# Patient Record
Sex: Female | Born: 1975
Health system: Southern US, Community
[De-identification: ages and names within clinical notes are randomized; demographics above are authoritative.]

## PROBLEM LIST (undated history)

## (undated) DIAGNOSIS — M5126 Other intervertebral disc displacement, lumbar region: Secondary | ICD-10-CM

## (undated) DIAGNOSIS — O99280 Endocrine, nutritional and metabolic diseases complicating pregnancy, unspecified trimester: Secondary | ICD-10-CM

## (undated) DIAGNOSIS — N819 Female genital prolapse, unspecified: Secondary | ICD-10-CM

## (undated) DIAGNOSIS — O1490 Unspecified pre-eclampsia, unspecified trimester: Secondary | ICD-10-CM

## (undated) DIAGNOSIS — E039 Hypothyroidism, unspecified: Secondary | ICD-10-CM

## (undated) DIAGNOSIS — D649 Anemia, unspecified: Secondary | ICD-10-CM

## (undated) DIAGNOSIS — M5136 Other intervertebral disc degeneration, lumbar region: Secondary | ICD-10-CM

## (undated) DIAGNOSIS — D352 Benign neoplasm of pituitary gland: Secondary | ICD-10-CM

## (undated) DIAGNOSIS — E221 Hyperprolactinemia: Secondary | ICD-10-CM

## (undated) HISTORY — DX: Female genital prolapse, unspecified: N81.9

## (undated) HISTORY — DX: Anemia, unspecified: D64.9

## (undated) HISTORY — DX: Other intervertebral disc degeneration, lumbar region: M51.36

## (undated) HISTORY — DX: Benign neoplasm of pituitary gland: D35.2

## (undated) HISTORY — DX: Endocrine, nutritional and metabolic diseases complicating pregnancy, unspecified trimester: O99.280

## (undated) HISTORY — DX: Unspecified pre-eclampsia, unspecified trimester: O14.90

## (undated) HISTORY — PX: MOUTH SURGERY: SHX715

## (undated) HISTORY — PX: OTHER SURGICAL HISTORY: SHX169

## (undated) HISTORY — DX: Other intervertebral disc displacement, lumbar region: M51.26

## (undated) HISTORY — DX: Hyperprolactinemia: E22.1

## (undated) HISTORY — DX: Hypothyroidism, unspecified: E03.9

---

## 1998-08-29 ENCOUNTER — Encounter: Payer: Self-pay | Admitting: Obstetrics and Gynecology

## 1998-08-29 ENCOUNTER — Inpatient Hospital Stay (HOSPITAL_COMMUNITY): Admission: AD | Admit: 1998-08-29 | Discharge: 1998-08-29 | Payer: Self-pay | Admitting: Obstetrics and Gynecology

## 1998-12-04 ENCOUNTER — Other Ambulatory Visit: Admission: RE | Admit: 1998-12-04 | Discharge: 1998-12-04 | Payer: Self-pay | Admitting: Obstetrics & Gynecology

## 1998-12-25 ENCOUNTER — Ambulatory Visit (HOSPITAL_COMMUNITY): Admission: RE | Admit: 1998-12-25 | Discharge: 1998-12-25 | Payer: Self-pay | Admitting: Obstetrics & Gynecology

## 1998-12-25 ENCOUNTER — Encounter: Payer: Self-pay | Admitting: Obstetrics & Gynecology

## 1999-10-02 ENCOUNTER — Inpatient Hospital Stay (HOSPITAL_COMMUNITY): Admission: AD | Admit: 1999-10-02 | Discharge: 1999-10-02 | Payer: Self-pay | Admitting: Obstetrics & Gynecology

## 1999-11-08 ENCOUNTER — Inpatient Hospital Stay (HOSPITAL_COMMUNITY): Admission: AD | Admit: 1999-11-08 | Discharge: 1999-11-10 | Payer: Self-pay | Admitting: Obstetrics and Gynecology

## 1999-12-09 ENCOUNTER — Other Ambulatory Visit: Admission: RE | Admit: 1999-12-09 | Discharge: 1999-12-09 | Payer: Self-pay | Admitting: Obstetrics and Gynecology

## 2001-03-23 ENCOUNTER — Other Ambulatory Visit: Admission: RE | Admit: 2001-03-23 | Discharge: 2001-03-23 | Payer: Self-pay | Admitting: Obstetrics & Gynecology

## 2001-04-02 ENCOUNTER — Ambulatory Visit (HOSPITAL_COMMUNITY): Admission: RE | Admit: 2001-04-02 | Discharge: 2001-04-02 | Payer: Self-pay | Admitting: Obstetrics & Gynecology

## 2001-04-02 ENCOUNTER — Encounter: Payer: Self-pay | Admitting: Obstetrics & Gynecology

## 2002-07-12 ENCOUNTER — Other Ambulatory Visit: Admission: RE | Admit: 2002-07-12 | Discharge: 2002-07-12 | Payer: Self-pay | Admitting: Obstetrics and Gynecology

## 2002-07-28 ENCOUNTER — Emergency Department (HOSPITAL_COMMUNITY): Admission: EM | Admit: 2002-07-28 | Discharge: 2002-07-28 | Payer: Self-pay | Admitting: Emergency Medicine

## 2003-05-09 ENCOUNTER — Encounter: Payer: Self-pay | Admitting: Internal Medicine

## 2003-05-09 ENCOUNTER — Ambulatory Visit (HOSPITAL_COMMUNITY): Admission: RE | Admit: 2003-05-09 | Discharge: 2003-05-09 | Payer: Self-pay | Admitting: Internal Medicine

## 2003-07-28 ENCOUNTER — Other Ambulatory Visit: Admission: RE | Admit: 2003-07-28 | Discharge: 2003-07-28 | Payer: Self-pay | Admitting: Obstetrics & Gynecology

## 2004-01-31 ENCOUNTER — Ambulatory Visit (HOSPITAL_COMMUNITY): Admission: RE | Admit: 2004-01-31 | Discharge: 2004-01-31 | Payer: Self-pay | Admitting: Internal Medicine

## 2004-06-22 ENCOUNTER — Inpatient Hospital Stay (HOSPITAL_COMMUNITY): Admission: AD | Admit: 2004-06-22 | Discharge: 2004-06-22 | Payer: Self-pay | Admitting: Obstetrics & Gynecology

## 2004-07-01 ENCOUNTER — Inpatient Hospital Stay (HOSPITAL_COMMUNITY): Admission: AD | Admit: 2004-07-01 | Discharge: 2004-07-01 | Payer: Self-pay | Admitting: Obstetrics & Gynecology

## 2004-07-07 ENCOUNTER — Inpatient Hospital Stay (HOSPITAL_COMMUNITY): Admission: AD | Admit: 2004-07-07 | Discharge: 2004-07-09 | Payer: Self-pay | Admitting: Obstetrics and Gynecology

## 2004-08-20 ENCOUNTER — Other Ambulatory Visit: Admission: RE | Admit: 2004-08-20 | Discharge: 2004-08-20 | Payer: Self-pay | Admitting: Obstetrics and Gynecology

## 2005-09-03 ENCOUNTER — Other Ambulatory Visit: Admission: RE | Admit: 2005-09-03 | Discharge: 2005-09-03 | Payer: Self-pay | Admitting: Obstetrics and Gynecology

## 2006-07-27 ENCOUNTER — Inpatient Hospital Stay (HOSPITAL_COMMUNITY): Admission: AD | Admit: 2006-07-27 | Discharge: 2006-07-27 | Payer: Self-pay | Admitting: Obstetrics & Gynecology

## 2006-08-06 ENCOUNTER — Inpatient Hospital Stay (HOSPITAL_COMMUNITY): Admission: AD | Admit: 2006-08-06 | Discharge: 2006-08-09 | Payer: Self-pay | Admitting: Obstetrics and Gynecology

## 2007-06-08 ENCOUNTER — Emergency Department (HOSPITAL_COMMUNITY): Admission: EM | Admit: 2007-06-08 | Discharge: 2007-06-08 | Payer: Self-pay | Admitting: Emergency Medicine

## 2008-05-08 ENCOUNTER — Ambulatory Visit (HOSPITAL_COMMUNITY): Admission: RE | Admit: 2008-05-08 | Discharge: 2008-05-08 | Payer: Self-pay | Admitting: Internal Medicine

## 2009-08-20 ENCOUNTER — Encounter: Admission: RE | Admit: 2009-08-20 | Discharge: 2009-08-20 | Payer: Self-pay | Admitting: Family Medicine

## 2009-11-07 ENCOUNTER — Emergency Department (HOSPITAL_COMMUNITY): Admission: EM | Admit: 2009-11-07 | Discharge: 2009-11-07 | Payer: Self-pay | Admitting: Emergency Medicine

## 2009-12-06 ENCOUNTER — Encounter: Admission: RE | Admit: 2009-12-06 | Discharge: 2009-12-06 | Payer: Self-pay | Admitting: Internal Medicine

## 2009-12-22 ENCOUNTER — Emergency Department (HOSPITAL_COMMUNITY): Admission: EM | Admit: 2009-12-22 | Discharge: 2009-12-22 | Payer: Self-pay | Admitting: Emergency Medicine

## 2009-12-24 ENCOUNTER — Emergency Department (HOSPITAL_BASED_OUTPATIENT_CLINIC_OR_DEPARTMENT_OTHER): Admission: EM | Admit: 2009-12-24 | Discharge: 2009-12-24 | Payer: Self-pay | Admitting: Emergency Medicine

## 2009-12-24 ENCOUNTER — Ambulatory Visit: Payer: Self-pay | Admitting: Diagnostic Radiology

## 2010-03-20 ENCOUNTER — Other Ambulatory Visit: Admission: RE | Admit: 2010-03-20 | Discharge: 2010-03-20 | Payer: Self-pay | Admitting: Obstetrics and Gynecology

## 2010-05-21 ENCOUNTER — Encounter: Admission: RE | Admit: 2010-05-21 | Discharge: 2010-05-21 | Payer: Self-pay | Admitting: Obstetrics and Gynecology

## 2010-09-08 ENCOUNTER — Encounter: Payer: Self-pay | Admitting: Sports Medicine

## 2010-09-08 ENCOUNTER — Encounter: Payer: Self-pay | Admitting: Obstetrics and Gynecology

## 2010-09-09 ENCOUNTER — Encounter: Payer: Self-pay | Admitting: Internal Medicine

## 2011-03-24 ENCOUNTER — Other Ambulatory Visit: Payer: Self-pay | Admitting: Obstetrics and Gynecology

## 2011-03-24 ENCOUNTER — Other Ambulatory Visit (HOSPITAL_COMMUNITY)
Admission: RE | Admit: 2011-03-24 | Discharge: 2011-03-24 | Disposition: A | Discharge status: Home / Self Care | Payer: 59 | Source: Ambulatory Visit | Attending: Obstetrics and Gynecology | Admitting: Obstetrics and Gynecology

## 2011-03-24 DIAGNOSIS — Z01419 Encounter for gynecological examination (general) (routine) without abnormal findings: Secondary | ICD-10-CM | POA: Insufficient documentation

## 2011-03-24 DIAGNOSIS — N644 Mastodynia: Secondary | ICD-10-CM

## 2011-03-24 DIAGNOSIS — N6310 Unspecified lump in the right breast, unspecified quadrant: Secondary | ICD-10-CM

## 2011-03-28 ENCOUNTER — Ambulatory Visit
Admission: RE | Admit: 2011-03-28 | Discharge: 2011-03-28 | Disposition: A | Discharge status: Home / Self Care | Payer: 59 | Source: Ambulatory Visit | Attending: Obstetrics and Gynecology | Admitting: Obstetrics and Gynecology

## 2011-03-28 DIAGNOSIS — N644 Mastodynia: Secondary | ICD-10-CM

## 2011-03-28 DIAGNOSIS — N6310 Unspecified lump in the right breast, unspecified quadrant: Secondary | ICD-10-CM

## 2012-04-23 ENCOUNTER — Other Ambulatory Visit: Payer: Self-pay | Admitting: Obstetrics and Gynecology

## 2012-04-23 ENCOUNTER — Other Ambulatory Visit (HOSPITAL_COMMUNITY)
Admission: RE | Admit: 2012-04-23 | Discharge: 2012-04-23 | Disposition: A | Discharge status: Home / Self Care | Payer: 59 | Source: Ambulatory Visit | Attending: Obstetrics and Gynecology | Admitting: Obstetrics and Gynecology

## 2012-04-23 DIAGNOSIS — Z01419 Encounter for gynecological examination (general) (routine) without abnormal findings: Secondary | ICD-10-CM | POA: Insufficient documentation

## 2012-04-23 DIAGNOSIS — Z1151 Encounter for screening for human papillomavirus (HPV): Secondary | ICD-10-CM | POA: Insufficient documentation

## 2012-05-28 ENCOUNTER — Ambulatory Visit: Payer: 59 | Admitting: Physical Therapy

## 2012-05-31 ENCOUNTER — Ambulatory Visit: Payer: 59 | Attending: Obstetrics and Gynecology | Admitting: Physical Therapy

## 2012-05-31 DIAGNOSIS — M2420 Disorder of ligament, unspecified site: Secondary | ICD-10-CM | POA: Insufficient documentation

## 2012-05-31 DIAGNOSIS — M629 Disorder of muscle, unspecified: Secondary | ICD-10-CM | POA: Insufficient documentation

## 2012-05-31 DIAGNOSIS — IMO0001 Reserved for inherently not codable concepts without codable children: Secondary | ICD-10-CM | POA: Insufficient documentation

## 2012-06-07 ENCOUNTER — Ambulatory Visit: Payer: 59 | Admitting: Physical Therapy

## 2012-06-14 ENCOUNTER — Ambulatory Visit: Payer: 59 | Admitting: Physical Therapy

## 2012-06-21 ENCOUNTER — Ambulatory Visit: Payer: 59 | Attending: Obstetrics and Gynecology | Admitting: Physical Therapy

## 2012-06-21 DIAGNOSIS — M629 Disorder of muscle, unspecified: Secondary | ICD-10-CM | POA: Insufficient documentation

## 2012-06-21 DIAGNOSIS — IMO0001 Reserved for inherently not codable concepts without codable children: Secondary | ICD-10-CM | POA: Insufficient documentation

## 2012-06-21 DIAGNOSIS — M2420 Disorder of ligament, unspecified site: Secondary | ICD-10-CM | POA: Insufficient documentation

## 2012-06-28 ENCOUNTER — Ambulatory Visit: Payer: 59 | Admitting: Physical Therapy

## 2012-07-05 ENCOUNTER — Encounter: Payer: 59 | Admitting: Physical Therapy

## 2012-07-12 ENCOUNTER — Encounter: Payer: 59 | Admitting: Physical Therapy

## 2012-07-19 ENCOUNTER — Ambulatory Visit: Payer: 59 | Attending: Obstetrics and Gynecology | Admitting: Physical Therapy

## 2012-07-19 DIAGNOSIS — IMO0001 Reserved for inherently not codable concepts without codable children: Secondary | ICD-10-CM | POA: Insufficient documentation

## 2012-07-19 DIAGNOSIS — M2420 Disorder of ligament, unspecified site: Secondary | ICD-10-CM | POA: Insufficient documentation

## 2012-07-19 DIAGNOSIS — M629 Disorder of muscle, unspecified: Secondary | ICD-10-CM | POA: Insufficient documentation

## 2012-07-26 ENCOUNTER — Ambulatory Visit: Payer: 59 | Admitting: Physical Therapy

## 2012-08-02 ENCOUNTER — Ambulatory Visit: Payer: 59 | Admitting: Physical Therapy

## 2012-08-09 ENCOUNTER — Ambulatory Visit: Payer: 59 | Admitting: Physical Therapy

## 2012-08-16 ENCOUNTER — Ambulatory Visit: Payer: 59 | Admitting: Physical Therapy

## 2013-03-09 ENCOUNTER — Other Ambulatory Visit (HOSPITAL_COMMUNITY): Payer: Self-pay | Admitting: Oral Surgery

## 2013-03-09 DIAGNOSIS — M2669 Other specified disorders of temporomandibular joint: Secondary | ICD-10-CM

## 2013-03-09 DIAGNOSIS — R6884 Jaw pain: Secondary | ICD-10-CM

## 2013-03-10 ENCOUNTER — Ambulatory Visit (HOSPITAL_COMMUNITY)
Admission: RE | Admit: 2013-03-10 | Discharge: 2013-03-10 | Disposition: A | Discharge status: Home / Self Care | Payer: 59 | Source: Ambulatory Visit | Attending: Oral Surgery | Admitting: Oral Surgery

## 2013-03-10 DIAGNOSIS — M2669 Other specified disorders of temporomandibular joint: Secondary | ICD-10-CM

## 2013-03-10 DIAGNOSIS — R6884 Jaw pain: Secondary | ICD-10-CM

## 2013-03-10 DIAGNOSIS — M26629 Arthralgia of temporomandibular joint, unspecified side: Secondary | ICD-10-CM | POA: Insufficient documentation

## 2014-06-15 ENCOUNTER — Other Ambulatory Visit: Payer: Self-pay

## 2014-06-15 DIAGNOSIS — Z1239 Encounter for other screening for malignant neoplasm of breast: Secondary | ICD-10-CM

## 2014-07-27 ENCOUNTER — Ambulatory Visit
Admission: RE | Admit: 2014-07-27 | Discharge: 2014-07-27 | Disposition: A | Discharge status: Home / Self Care | Payer: BC Managed Care – PPO | Source: Ambulatory Visit

## 2014-07-27 DIAGNOSIS — Z1239 Encounter for other screening for malignant neoplasm of breast: Secondary | ICD-10-CM

## 2014-09-28 ENCOUNTER — Other Ambulatory Visit (HOSPITAL_COMMUNITY)
Admission: RE | Admit: 2014-09-28 | Discharge: 2014-09-28 | Disposition: A | Discharge status: Home / Self Care | Payer: BLUE CROSS/BLUE SHIELD | Source: Ambulatory Visit | Attending: Obstetrics and Gynecology | Admitting: Obstetrics and Gynecology

## 2014-09-28 ENCOUNTER — Other Ambulatory Visit: Payer: Self-pay | Admitting: Obstetrics and Gynecology

## 2014-09-28 DIAGNOSIS — Z1151 Encounter for screening for human papillomavirus (HPV): Secondary | ICD-10-CM | POA: Diagnosis present

## 2014-09-28 DIAGNOSIS — Z01419 Encounter for gynecological examination (general) (routine) without abnormal findings: Secondary | ICD-10-CM | POA: Insufficient documentation

## 2014-10-02 LAB — CYTOLOGY - PAP

## 2015-07-24 ENCOUNTER — Other Ambulatory Visit: Payer: Self-pay | Admitting: Otolaryngology

## 2015-07-24 DIAGNOSIS — H90A22 Sensorineural hearing loss, unilateral, left ear, with restricted hearing on the contralateral side: Secondary | ICD-10-CM

## 2015-07-27 ENCOUNTER — Other Ambulatory Visit: Payer: Self-pay

## 2015-07-27 DIAGNOSIS — Z1231 Encounter for screening mammogram for malignant neoplasm of breast: Secondary | ICD-10-CM

## 2015-07-31 ENCOUNTER — Ambulatory Visit
Admission: RE | Admit: 2015-07-31 | Discharge: 2015-07-31 | Disposition: A | Discharge status: Home / Self Care | Payer: BLUE CROSS/BLUE SHIELD | Source: Ambulatory Visit | Attending: Otolaryngology | Admitting: Otolaryngology

## 2015-07-31 DIAGNOSIS — H90A22 Sensorineural hearing loss, unilateral, left ear, with restricted hearing on the contralateral side: Secondary | ICD-10-CM

## 2015-08-02 ENCOUNTER — Ambulatory Visit
Admission: RE | Admit: 2015-08-02 | Discharge: 2015-08-02 | Disposition: A | Discharge status: Home / Self Care | Payer: BLUE CROSS/BLUE SHIELD | Source: Ambulatory Visit

## 2015-08-02 DIAGNOSIS — Z1231 Encounter for screening mammogram for malignant neoplasm of breast: Secondary | ICD-10-CM

## 2015-10-02 ENCOUNTER — Other Ambulatory Visit (HOSPITAL_COMMUNITY)
Admission: RE | Admit: 2015-10-02 | Discharge: 2015-10-02 | Disposition: A | Discharge status: Home / Self Care | Payer: BLUE CROSS/BLUE SHIELD | Source: Ambulatory Visit | Attending: Obstetrics and Gynecology | Admitting: Obstetrics and Gynecology

## 2015-10-02 ENCOUNTER — Other Ambulatory Visit: Payer: Self-pay | Admitting: Obstetrics and Gynecology

## 2015-10-02 DIAGNOSIS — Z01419 Encounter for gynecological examination (general) (routine) without abnormal findings: Secondary | ICD-10-CM | POA: Insufficient documentation

## 2015-10-03 LAB — CYTOLOGY - PAP

## 2015-10-16 ENCOUNTER — Ambulatory Visit (INDEPENDENT_AMBULATORY_CARE_PROVIDER_SITE_OTHER): Payer: BLUE CROSS/BLUE SHIELD | Admitting: Neurology

## 2015-10-16 ENCOUNTER — Encounter: Payer: Self-pay | Admitting: Neurology

## 2015-10-16 VITALS — BP 100/70 | HR 74 | Resp 20 | Ht 62.0 in | Wt 123.0 lb

## 2015-10-16 DIAGNOSIS — G478 Other sleep disorders: Secondary | ICD-10-CM | POA: Diagnosis not present

## 2015-10-16 DIAGNOSIS — F518 Other sleep disorders not due to a substance or known physiological condition: Secondary | ICD-10-CM | POA: Diagnosis not present

## 2015-10-16 DIAGNOSIS — R6889 Other general symptoms and signs: Secondary | ICD-10-CM

## 2015-10-16 DIAGNOSIS — G4719 Other hypersomnia: Secondary | ICD-10-CM
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
## 2015-11-01 ENCOUNTER — Telehealth: Payer: Self-pay

## 2015-11-01 NOTE — Telephone Encounter (Signed)
Called pt to discuss HLA results. No answer, left a message asking her to call me back.

## 2015-11-05 NOTE — Telephone Encounter (Signed)
Patient called back for HLA results. Please call back.  Thanks!

## 2015-11-05 NOTE — Telephone Encounter (Signed)
Spoke to pt and advised her that her HLA test came back negative and to proceed with sleep studies as ordered by Dr. Dohmeier. Pt said she will try and get the sleep studies completed but they are expensive so she will try and meet her deductible before she gets them scheduled. Pt verbalized understanding.  

## 2015-11-05 NOTE — Telephone Encounter (Signed)
Called pt back, no answer, left a message asking her to call me back. HLA was negative. Pt should proceed with PSG and MSLT.

## 2016-01-17 DIAGNOSIS — M779 Enthesopathy, unspecified: Secondary | ICD-10-CM | POA: Diagnosis not present

## 2016-01-17 DIAGNOSIS — M791 Myalgia: Secondary | ICD-10-CM | POA: Diagnosis not present

## 2016-01-28 DIAGNOSIS — M791 Myalgia: Secondary | ICD-10-CM | POA: Diagnosis not present

## 2016-01-28 DIAGNOSIS — M779 Enthesopathy, unspecified: Secondary | ICD-10-CM | POA: Diagnosis not present

## 2016-02-20 DIAGNOSIS — M791 Myalgia: Secondary | ICD-10-CM | POA: Diagnosis not present

## 2016-02-20 DIAGNOSIS — M779 Enthesopathy, unspecified: Secondary | ICD-10-CM | POA: Diagnosis not present

## 2016-03-10 DIAGNOSIS — M791 Myalgia: Secondary | ICD-10-CM | POA: Diagnosis not present

## 2016-03-10 DIAGNOSIS — M779 Enthesopathy, unspecified: Secondary | ICD-10-CM | POA: Diagnosis not present

## 2016-03-27 DIAGNOSIS — M791 Myalgia: Secondary | ICD-10-CM | POA: Diagnosis not present

## 2016-03-27 DIAGNOSIS — M779 Enthesopathy, unspecified: Secondary | ICD-10-CM | POA: Diagnosis not present

## 2016-04-01 DIAGNOSIS — M779 Enthesopathy, unspecified: Secondary | ICD-10-CM | POA: Diagnosis not present

## 2016-04-01 DIAGNOSIS — M791 Myalgia: Secondary | ICD-10-CM | POA: Diagnosis not present

## 2016-06-19 DIAGNOSIS — N819 Female genital prolapse, unspecified: Secondary | ICD-10-CM | POA: Diagnosis not present

## 2016-10-29 DIAGNOSIS — N898 Other specified noninflammatory disorders of vagina: Secondary | ICD-10-CM | POA: Diagnosis not present

## 2016-10-29 DIAGNOSIS — Z3200 Encounter for pregnancy test, result unknown: Secondary | ICD-10-CM | POA: Diagnosis not present

## 2016-10-29 DIAGNOSIS — R300 Dysuria: Secondary | ICD-10-CM | POA: Diagnosis not present

## 2016-10-29 DIAGNOSIS — R102 Pelvic and perineal pain: Secondary | ICD-10-CM | POA: Diagnosis not present

## 2016-10-29 DIAGNOSIS — R1909 Other intra-abdominal and pelvic swelling, mass and lump: Secondary | ICD-10-CM | POA: Diagnosis not present

## 2016-11-17 DIAGNOSIS — R102 Pelvic and perineal pain: Secondary | ICD-10-CM | POA: Diagnosis not present

## 2016-11-25 DIAGNOSIS — Z1231 Encounter for screening mammogram for malignant neoplasm of breast: Secondary | ICD-10-CM | POA: Diagnosis not present

## 2016-11-25 DIAGNOSIS — Z6823 Body mass index (BMI) 23.0-23.9, adult: Secondary | ICD-10-CM | POA: Diagnosis not present

## 2016-11-25 DIAGNOSIS — Z1151 Encounter for screening for human papillomavirus (HPV): Secondary | ICD-10-CM | POA: Diagnosis not present

## 2016-11-25 DIAGNOSIS — Z01419 Encounter for gynecological examination (general) (routine) without abnormal findings: Secondary | ICD-10-CM | POA: Diagnosis not present

## 2017-03-31 DIAGNOSIS — M79644 Pain in right finger(s): Secondary | ICD-10-CM | POA: Diagnosis not present

## 2017-06-03 DIAGNOSIS — R239 Unspecified skin changes: Secondary | ICD-10-CM | POA: Diagnosis not present

## 2017-07-04 ENCOUNTER — Other Ambulatory Visit: Payer: Self-pay

## 2017-07-04 ENCOUNTER — Emergency Department (HOSPITAL_BASED_OUTPATIENT_CLINIC_OR_DEPARTMENT_OTHER)
Admission: EM | Admit: 2017-07-04 | Discharge: 2017-07-04 | Disposition: A | Discharge status: Home / Self Care | Payer: BLUE CROSS/BLUE SHIELD | Attending: Emergency Medicine | Admitting: Emergency Medicine

## 2017-07-04 ENCOUNTER — Emergency Department (HOSPITAL_BASED_OUTPATIENT_CLINIC_OR_DEPARTMENT_OTHER): Payer: BLUE CROSS/BLUE SHIELD

## 2017-07-04 ENCOUNTER — Encounter (HOSPITAL_BASED_OUTPATIENT_CLINIC_OR_DEPARTMENT_OTHER): Payer: Self-pay | Admitting: Emergency Medicine

## 2017-07-04 DIAGNOSIS — Y92331 Roller skating rink as the place of occurrence of the external cause: Secondary | ICD-10-CM | POA: Insufficient documentation

## 2017-07-04 DIAGNOSIS — M25511 Pain in right shoulder: Secondary | ICD-10-CM | POA: Diagnosis not present

## 2017-07-04 DIAGNOSIS — R220 Localized swelling, mass and lump, head: Secondary | ICD-10-CM | POA: Diagnosis not present

## 2017-07-04 DIAGNOSIS — W19XXXA Unspecified fall, initial encounter: Secondary | ICD-10-CM | POA: Diagnosis not present

## 2017-07-04 DIAGNOSIS — S0990XA Unspecified injury of head, initial encounter: Secondary | ICD-10-CM | POA: Diagnosis not present

## 2017-07-04 DIAGNOSIS — Z79899 Other long term (current) drug therapy: Secondary | ICD-10-CM | POA: Diagnosis not present

## 2017-07-04 DIAGNOSIS — M542 Cervicalgia: Secondary | ICD-10-CM | POA: Insufficient documentation

## 2017-07-04 DIAGNOSIS — S060X1A Concussion with loss of consciousness of 30 minutes or less, initial encounter: Secondary | ICD-10-CM | POA: Diagnosis not present

## 2017-07-04 DIAGNOSIS — R51 Headache: Secondary | ICD-10-CM | POA: Insufficient documentation

## 2017-07-04 DIAGNOSIS — M79641 Pain in right hand: Secondary | ICD-10-CM | POA: Diagnosis not present

## 2017-07-04 DIAGNOSIS — Y9351 Activity, roller skating (inline) and skateboarding: Secondary | ICD-10-CM | POA: Insufficient documentation

## 2017-07-04 DIAGNOSIS — R42 Dizziness and giddiness: Secondary | ICD-10-CM | POA: Diagnosis not present

## 2017-07-04 DIAGNOSIS — S069X9A Unspecified intracranial injury with loss of consciousness of unspecified duration, initial encounter: Secondary | ICD-10-CM | POA: Diagnosis not present

## 2017-07-04 DIAGNOSIS — Y998 Other external cause status: Secondary | ICD-10-CM | POA: Diagnosis not present

## 2017-07-04 DIAGNOSIS — S199XXA Unspecified injury of neck, initial encounter: Secondary | ICD-10-CM | POA: Diagnosis not present

## 2017-07-04 MED ORDER — ORPHENADRINE CITRATE ER 100 MG PO TB12: 100.0000 mg | ORAL_TABLET | Freq: Two times a day (BID) | ORAL | 0 refills | Status: DC

## 2017-07-04 MED ORDER — IBUPROFEN 600 MG PO TABS: 600.0000 mg | ORAL_TABLET | Freq: Four times a day (QID) | ORAL | 0 refills | Status: DC | PRN

## 2017-07-04 NOTE — ED Provider Notes (Signed)
[facility] Provider Note   CSN: [id number] Arrival date & time: 07/04/17  1622     History   Chief Complaint Chief Complaint  Patient presents with  . Head Injury    HPI Donna Conway is a 41 y.o. female.  HPI Was rollerskating and unexpectedly fell.  Patient reports he never falls and suddenly the next thing she knew she was waking up from the ground with people lifting her up.  She reports she felt dizzy and still kind of disoriented as she was being moved to the edge of the rink.  It took her a little while to be able to cooperate in moving.  Reports now she has a bad headache and swelling on the right temple and the back of her neck hurts.  Ports she also has pain on her right shoulder and palm.  She reports that the time she felt that her vision was dark and she could not see immediately after the episode.  She reports that has resolved and she has no double vision or loss of vision.  Patient reports she does have somewhat of a tendency to pass out when she stands up quickly and it is at those times as well that her vision goes generally dark like she experienced today after the fall. Past Medical History:  Diagnosis Date  . Anemia   . Bulging lumbar disc    c4-c5  . Hyperprolactinemia ([place])   . Hypothyroidism during pregnancy   . Pelvic prolapse   . Pituitary microadenoma ([place])   . Toxemia in pregnancy     There are no active problems to display for this patient.   Past Surgical History:  Procedure Laterality Date  . cyst removed from eye    . MOUTH SURGERY      OB History    No data available       Home Medications    Prior to Admission medications   Medication Sig Start Date End Date Taking? Authorizing Provider  ibuprofen (ADVIL,MOTRIN) 600 MG tablet Take 1 tablet (600 mg total) every 6 (six) hours as needed by mouth. 07/04/17   Charlesetta Shanks, MD  MELATONIN PO Take 0.75 mg by mouth.    [provider]    meloxicam (MOBIC) 15 MG tablet Take 15 mg by mouth as needed for pain.    [provider]  orphenadrine (NORFLEX) 100 MG tablet Take 1 tablet (100 mg total) 2 (two) times daily by mouth. 07/04/17   Charlesetta Shanks, MD  triamcinolone (NASACORT) 55 MCG/ACT AERO nasal inhaler Place into the nose.    [provider]    Family History Family History  Problem Relation Age of Onset  . Hypertension Mother   . Diabetes Mellitus I Paternal Grandmother   . Diabetes Mellitus I Maternal Grandfather     Social History Social History   Tobacco Use  . Smoking status: Never Smoker  . Smokeless tobacco: Never Used  Substance Use Topics  . Alcohol use: No    Alcohol/week: 0.0 oz  . Drug use: No     Allergies   Patient has no known allergies.   Review of Systems Review of Systems 10 Systems reviewed and are negative for acute change except as noted in the HPI.   Physical Exam Updated Vital Signs BP 112/73   Pulse 65   Temp 98.6 F (37 C) (Oral)   Resp 17   Ht 5\' 2"  (1.575 m)   Wt 54  kg (119 lb)   LMP 06/30/2017   SpO2 100%   BMI 21.77 kg/m   Physical Exam  Constitutional: She appears well-developed and well-nourished. No distress.  HENT:  Right Ear: External ear normal.  Left Ear: External ear normal.  Nose: Nose normal.  Mouth/Throat: Oropharynx is clear and moist.  Contusion and tenderness over the right temple.  No laceration or abrasion.  Eyes: Conjunctivae and EOM are normal. Pupils are equal, round, and reactive to light.  Neck: Neck supple.  Endorses tenderness to palpation at C5 through C7.  Cardiovascular: Normal rate and regular rhythm.  No murmur heard. Pulmonary/Chest: Effort normal and breath sounds normal. No respiratory distress.  Abdominal: Soft. There is no tenderness.  Musculoskeletal: Normal range of motion. She exhibits tenderness. She exhibits no edema.  Redness over point of shoulder on the right.  Range of motion intact.   Neurological: She is alert.  Skin: Skin is warm and dry.  Psychiatric: She has a normal mood and affect.  Nursing note and vitals reviewed.    ED Treatments / Results  Labs (all labs ordered are listed, but only abnormal results are displayed) Labs Reviewed - No data to display  EKG  EKG Interpretation None       Radiology Ct Head Wo Contrast  Result Date: 07/04/2017 CLINICAL DATA:  Patient fell while roller-skating. Loss of consciousness and right-sided headache. EXAM: CT HEAD WITHOUT CONTRAST CT CERVICAL SPINE WITHOUT CONTRAST TECHNIQUE: Multidetector CT imaging of the head and cervical spine was performed following the standard protocol without intravenous contrast. Multiplanar CT image reconstructions of the cervical spine were also generated. COMPARISON:  Cervical spine radiographs 04/15/2010, CT head 11/07/2009 FINDINGS: CT HEAD FINDINGS Brain: No evidence of acute infarction, hemorrhage, hydrocephalus, extra-axial collection or mass lesion/mass effect. Vascular: No hyperdense vessel or unexpected calcification. Skull: Normal. Negative for fracture or focal lesion. Sinuses/Orbits: No acute finding. Other: Mild soft tissue swelling of the right parietal scalp. CT CERVICAL SPINE FINDINGS Alignment: Normal. No splaying of the lateral masses of C1 on CT. Mild asymmetry about the odontoid process on the AP view may be due to positioning of the head. Skull base and vertebrae: No acute fracture. No primary bone lesion or focal pathologic process. Soft tissues and spinal canal: No prevertebral fluid or swelling. No visible canal hematoma. Disc levels: No significant central or neural foraminal encroachment. No focal disc herniations. Upper chest: Negative. Other: None IMPRESSION: 1. No acute intracranial abnormality. 2. No acute cervical spine fracture or subluxation. 3. Mild right parietal scalp soft tissue swelling. Electronically Signed   By: Ashley Royalty M.D.   On: 07/04/2017 19:35   Ct  Cervical Spine Wo Contrast  Result Date: 07/04/2017 CLINICAL DATA:  Patient fell while roller-skating. Loss of consciousness and right-sided headache. EXAM: CT HEAD WITHOUT CONTRAST CT CERVICAL SPINE WITHOUT CONTRAST TECHNIQUE: Multidetector CT imaging of the head and cervical spine was performed following the standard protocol without intravenous contrast. Multiplanar CT image reconstructions of the cervical spine were also generated. COMPARISON:  Cervical spine radiographs 04/15/2010, CT head 11/07/2009 FINDINGS: CT HEAD FINDINGS Brain: No evidence of acute infarction, hemorrhage, hydrocephalus, extra-axial collection or mass lesion/mass effect. Vascular: No hyperdense vessel or unexpected calcification. Skull: Normal. Negative for fracture or focal lesion. Sinuses/Orbits: No acute finding. Other: Mild soft tissue swelling of the right parietal scalp. CT CERVICAL SPINE FINDINGS Alignment: Normal. No splaying of the lateral masses of C1 on CT. Mild asymmetry about the odontoid process on the AP view may  be due to positioning of the head. Skull base and vertebrae: No acute fracture. No primary bone lesion or focal pathologic process. Soft tissues and spinal canal: No prevertebral fluid or swelling. No visible canal hematoma. Disc levels: No significant central or neural foraminal encroachment. No focal disc herniations. Upper chest: Negative. Other: None IMPRESSION: 1. No acute intracranial abnormality. 2. No acute cervical spine fracture or subluxation. 3. Mild right parietal scalp soft tissue swelling. Electronically Signed   By: Ashley Royalty M.D.   On: 07/04/2017 19:35    Procedures Procedures (including critical care time)  Medications Ordered in ED Medications - No data to display   Initial Impression / Assessment and Plan / ED Course  I have reviewed the triage vital signs and the nursing notes.  Pertinent labs & imaging results that were available during my care of the patient were reviewed by  me and considered in my medical decision making (see chart for details).     Final Clinical Impressions(s) / ED Diagnoses   Final diagnoses:  Injury of head, initial encounter  Concussion with loss of consciousness of 30 minutes or less, initial encounter  CT head and cervical spine negative.  Patient is alert and appropriate.  She does not have confusion or somnolence.  No neurologic deficit.  At this time findings consistent with concussion.  Patient had a prolonged period of feeling near syncopal after initial injury.  She does have persistent headache.  No nausea vomiting or visual changes.  Recommendations will be for ibuprofen as needed and follow head injury and concussion discharge instructions with close follow-up.  Advised to return if any worsening or changing of symptoms.  ED Discharge Orders        Ordered    ibuprofen (ADVIL,MOTRIN) 600 MG tablet  Every 6 hours PRN     07/04/17 2035    orphenadrine (NORFLEX) 100 MG tablet  2 times daily     07/04/17 2035       Charlesetta Shanks, MD 07/04/17 2038

## 2017-07-04 NOTE — ED Notes (Signed)
Pt and FM given d/c instructions as per chart. Verbalizes understanding. No questions. Rx x 2 with precautions.

## 2017-07-04 NOTE — ED Notes (Signed)
Patient transported to CT 

## 2017-07-04 NOTE — ED Triage Notes (Signed)
Pt sts she fell while roller skating at a skating rink; hit head on floor, but does not remember fall; sts everything was black when she came to

## 2017-07-04 NOTE — ED Notes (Signed)
Resting comfortably. Alert, NAD, calm, interactive, resps e/u, speaking in clear complete sentences, no dyspnea noted, skin W&D, mentions HA and intermittent nausea (mild), (denies: other pain, sob, dizziness or visual changes). Family at [facility].

## 2017-07-04 NOTE — ED Notes (Signed)
Pt on cardiac monitor and auto VS 

## 2017-07-08 DIAGNOSIS — S060X1D Concussion with loss of consciousness of 30 minutes or less, subsequent encounter: Secondary | ICD-10-CM | POA: Diagnosis not present

## 2017-07-08 DIAGNOSIS — S46911D Strain of unspecified muscle, fascia and tendon at shoulder and upper arm level, right arm, subsequent encounter: Secondary | ICD-10-CM | POA: Diagnosis not present

## 2017-08-07 DIAGNOSIS — H539 Unspecified visual disturbance: Secondary | ICD-10-CM | POA: Diagnosis not present

## 2017-08-15 ENCOUNTER — Other Ambulatory Visit: Payer: Self-pay | Admitting: Ophthalmology

## 2017-08-17 ENCOUNTER — Other Ambulatory Visit: Payer: Self-pay | Admitting: Ophthalmology

## 2017-08-17 DIAGNOSIS — F0151 Vascular dementia with behavioral disturbance: Principal | ICD-10-CM

## 2017-08-17 DIAGNOSIS — R410 Disorientation, unspecified: Principal | ICD-10-CM

## 2017-08-17 DIAGNOSIS — R230 Cyanosis: Secondary | ICD-10-CM

## 2017-08-17 DIAGNOSIS — F05 Delirium due to known physiological condition: Secondary | ICD-10-CM

## 2017-08-17 DIAGNOSIS — F0150 Vascular dementia without behavioral disturbance: Secondary | ICD-10-CM

## 2017-08-25 ENCOUNTER — Other Ambulatory Visit: Payer: BLUE CROSS/BLUE SHIELD

## 2017-08-27 ENCOUNTER — Ambulatory Visit
Admission: RE | Admit: 2017-08-27 | Discharge: 2017-08-27 | Disposition: A | Discharge status: Home / Self Care | Payer: BLUE CROSS/BLUE SHIELD | Source: Ambulatory Visit | Attending: Ophthalmology | Admitting: Ophthalmology

## 2017-08-27 DIAGNOSIS — D353 Benign neoplasm of craniopharyngeal duct: Secondary | ICD-10-CM

## 2017-08-27 DIAGNOSIS — R230 Cyanosis: Secondary | ICD-10-CM

## 2017-08-27 DIAGNOSIS — F0150 Vascular dementia without behavioral disturbance: Secondary | ICD-10-CM

## 2017-08-27 DIAGNOSIS — D352 Benign neoplasm of pituitary gland: Secondary | ICD-10-CM

## 2017-08-27 DIAGNOSIS — R410 Disorientation, unspecified: Principal | ICD-10-CM

## 2017-08-27 DIAGNOSIS — F0390 Unspecified dementia without behavioral disturbance: Secondary | ICD-10-CM | POA: Diagnosis not present

## 2017-08-27 DIAGNOSIS — F0151 Vascular dementia with behavioral disturbance: Principal | ICD-10-CM

## 2017-08-27 MED ORDER — GADOBENATE DIMEGLUMINE 529 MG/ML IV SOLN
5.0000 mL | Freq: Once | INTRAVENOUS | Status: AC | PRN
  Administered 2017-08-27: 5 mL via INTRAVENOUS

## 2017-09-03 ENCOUNTER — Other Ambulatory Visit: Payer: Self-pay

## 2017-09-03 ENCOUNTER — Other Ambulatory Visit: Payer: Self-pay | Admitting: Ophthalmology

## 2017-09-03 DIAGNOSIS — D353 Benign neoplasm of craniopharyngeal duct: Principal | ICD-10-CM

## 2017-09-03 DIAGNOSIS — D352 Benign neoplasm of pituitary gland: Secondary | ICD-10-CM

## 2017-10-13 DIAGNOSIS — J029 Acute pharyngitis, unspecified: Secondary | ICD-10-CM | POA: Diagnosis not present

## 2018-01-14 DIAGNOSIS — H538 Other visual disturbances: Secondary | ICD-10-CM | POA: Diagnosis not present

## 2018-01-14 DIAGNOSIS — G43109 Migraine with aura, not intractable, without status migrainosus: Secondary | ICD-10-CM | POA: Diagnosis not present

## 2018-01-14 DIAGNOSIS — F0781 Postconcussional syndrome: Secondary | ICD-10-CM | POA: Diagnosis not present

## 2018-02-12 DIAGNOSIS — Z6822 Body mass index (BMI) 22.0-22.9, adult: Secondary | ICD-10-CM | POA: Diagnosis not present

## 2018-02-12 DIAGNOSIS — Z1231 Encounter for screening mammogram for malignant neoplasm of breast: Secondary | ICD-10-CM | POA: Diagnosis not present

## 2018-02-12 DIAGNOSIS — Z01419 Encounter for gynecological examination (general) (routine) without abnormal findings: Secondary | ICD-10-CM | POA: Diagnosis not present

## 2018-06-08 DIAGNOSIS — R0789 Other chest pain: Secondary | ICD-10-CM | POA: Diagnosis not present

## 2018-06-08 DIAGNOSIS — Z0000 Encounter for general adult medical examination without abnormal findings: Secondary | ICD-10-CM | POA: Diagnosis not present

## 2018-07-21 ENCOUNTER — Ambulatory Visit
Admission: RE | Admit: 2018-07-21 | Discharge: 2018-07-21 | Disposition: A | Discharge status: Home / Self Care | Payer: BLUE CROSS/BLUE SHIELD | Source: Ambulatory Visit | Attending: Family Medicine | Admitting: Family Medicine

## 2018-07-21 ENCOUNTER — Other Ambulatory Visit: Payer: Self-pay | Admitting: Family Medicine

## 2018-07-21 DIAGNOSIS — R0789 Other chest pain: Secondary | ICD-10-CM

## 2018-07-21 DIAGNOSIS — S299XXA Unspecified injury of thorax, initial encounter: Secondary | ICD-10-CM | POA: Diagnosis not present

## 2018-07-28 DIAGNOSIS — B029 Zoster without complications: Secondary | ICD-10-CM | POA: Diagnosis not present

## 2018-07-28 DIAGNOSIS — H6123 Impacted cerumen, bilateral: Secondary | ICD-10-CM | POA: Diagnosis not present

## 2018-07-29 ENCOUNTER — Telehealth: Payer: Self-pay | Admitting: Neurology

## 2018-07-29 ENCOUNTER — Ambulatory Visit: Payer: BLUE CROSS/BLUE SHIELD | Admitting: Neurology

## 2018-07-29 ENCOUNTER — Encounter

## 2018-07-29 NOTE — Telephone Encounter (Signed)
Called the patient, Dr Brett Fairy will be out of the office this afternoon and we need to reschedule with Dr Dohmeier. LVM.

## 2018-08-09 ENCOUNTER — Encounter: Payer: Self-pay | Admitting: Neurology

## 2018-08-09 ENCOUNTER — Ambulatory Visit (INDEPENDENT_AMBULATORY_CARE_PROVIDER_SITE_OTHER): Payer: BLUE CROSS/BLUE SHIELD | Admitting: Neurology

## 2018-08-09 VITALS — BP 108/69 | HR 71 | Ht 62.0 in | Wt 131.0 lb

## 2018-08-09 DIAGNOSIS — H539 Unspecified visual disturbance: Secondary | ICD-10-CM | POA: Diagnosis not present

## 2018-08-09 NOTE — Progress Notes (Signed)
Provider:  Larey Seat, MD   Referring Provider: Shirline Frees, MD   Primary Care Physician: Dr. Julian Reil, MD and [facility] .    Patient was seen by me in February 2017 for sleep issues, and she has no longer insomnia, has implemented sleep hygiene.   She returned for a new problem , visual changes , previously had a pituitary adenoma.  "Donna Conway is a 42 y.o. female, who went rollerskating and unexpectedly fell in 06-2017 .  Patient reports he never falls and suddenly the next thing she knew she was waking up from the ground with people lifting her up.  She reports she felt dizzy and still kind of disoriented as she was being moved to the edge of the rink.  It took her a little while to be able to cooperate in moving.  Reports now she has a bad headache and swelling on the right temple and the back of her neck hurts."  The fall exacerbated what had begun before. No fractures. Reports frequent lightheadedness and chest pain, up to 8 times a week. Spells of nearly fainting - or the sensation thereoff.  She feels her vision blurring, and a fuzzy curtain lowering into the visual field- and at that point needs to hold in her actions to allow herself to stabilize, not to fall. She  can climb stairs but gets " woozy" . She experienced these also while carrying heavy laundry hampers or while doing yard work.    Medical history: See below- before 2017 she had vision Black put spells. Concussion in 2018 .  Pituitary adenoma , micro- on bromocryptine, now atrophied.  Hypotension.    Social history: trained as a Environmental education officer, teaching, she is married with children, she ice skates.    Review of Systems: Out of a complete 14 system review, the patient complains of only the following symptoms, and all other reviewed systems are negative.  left hearing loss, left TMJ click, lightheadedness, vertigo.   Social History   Socioeconomic History  . Marital status:  Married    Spouse name: Not on file  . Number of children: Not on file  . Years of education: Not on file  . Highest education level: Not on file  Occupational History  . Not on file  Social Needs  . Financial resource strain: Not on file  . Food insecurity:    Worry: Not on file    Inability: Not on file  . Transportation needs:    Medical: Not on file    Non-medical: Not on file  Tobacco Use  . Smoking status: Never Smoker  . Smokeless tobacco: Never Used  Substance and Sexual Activity  . Alcohol use: No    Alcohol/week: 0.0 standard drinks  . Drug use: No  . Sexual activity: Not on file  Lifestyle  . Physical activity:    Days per week: Not on file    Minutes per session: Not on file  . Stress: Not on file  Relationships  . Social connections:    Talks on phone: Not on file    Gets together: Not on file    Attends religious service: Not on file    Active member of club or organization: Not on file    Attends meetings of clubs or organizations: Not on file    Relationship status: Not on file  . Intimate partner violence:    Fear of current or ex partner: Not on  file    Emotionally abused: Not on file    Physically abused: Not on file    Forced sexual activity: Not on file  Other Topics Concern  . Not on file  Social History Narrative   Denies caffeine use.    Family History  Problem Relation Age of Onset  . Hypertension Mother   . Diabetes Mellitus I Paternal Grandmother   . Diabetes Mellitus I Maternal Grandfather     Past Medical History:  Diagnosis Date  . Anemia   . Bulging lumbar disc    c4-c5  . Hyperprolactinemia ([place])   . Hypothyroidism during pregnancy   . Pelvic prolapse   . Pituitary microadenoma ([place])   . Toxemia in pregnancy   microadenoma was treated.   Past Surgical History:  Procedure Laterality Date  . cyst removed from eye    . MOUTH SURGERY      Current Outpatient Medications  Medication Sig Dispense Refill  .  triamcinolone (NASACORT) 55 MCG/ACT AERO nasal inhaler Place into the nose.     No current facility-administered medications for this visit.     Allergies as of 08/09/2018  . (No Known Allergies)    Vitals: BP 108/69   Pulse 71   Ht 5\' 2"  (1.575 m)   Wt 131 lb (59.4 kg)   BMI 23.96 kg/m  Last Weight:  Wt Readings from Last 1 Encounters:  08/09/18 131 lb (59.4 kg)   Last Height:   Ht Readings from Last 1 Encounters:  08/09/18 5\' 2"  (1.575 m)    Physical exam:  General: The patient is awake, alert and appears not in acute distress. The patient is well groomed. Head: Normocephalic, atraumatic. Neck is supple. Mallampati 3, neck circumference: 13" Cardiovascular:  Regular rate and rhythm , without  murmurs or carotid bruit, and without distended neck veins. Respiratory: Lungs are clear to auscultation. Skin:  Without evidence of sedema, or rash Trunk: BMI 24 kg/m2 ( in clothes).  Neurologic exam : The patient is awake and alert, oriented to place and time.  Memory subjective described as intact. There is attention & concentration ability, but our conversation. Speech is fluent, logorrhoeic, pressured / Mood and affect are worried about a medical records issue.  Cranial nerves: Pupils are equal and briskly reactive to light. Funduscopic exam without  evidence of pallor or edema. Extraocular movements  in vertical and horizontal planes intact and without nystagmus.  Visual fields by finger perimetry are intact. Hearing to finger rub intact.  Facial sensation intact to fine touch. Facial motor strength is symmetric and tongue and uvula move midline. Tongue protrusion into either cheek is normal. Shoulder shrug is normal.   Motor exam:  Normal tone, muscle bulk and symmetric strength in all extremities. Good bilateral grip strength.  Sensory:  Fine touch, pinprick and vibration were tested in all extremities. Proprioception was normal.  Coordination: Rapid alternating movements  in the fingers/hands were normal without evidence of ataxia, dysmetria or tremor.  Gait and station: Patient walks without assistive device and is able  to climb up to the exam table unassisted. Strength within normal limits. Stance is stable and normal. Turns to the right with 4 steps , turns to the left with 4 steps. Reports some balance issue with right turns, none is seen outwardly.  Tandem gait is unfragmented. Romberg testing is negative   Deep tendon reflexes: in the  upper and lower extremities are symmetric and intact. Babinski maneuver deferred.    Assessment:  After physical and neurologic examination, review of laboratory studies, imaging, neurophysiology testing and pre-existing records, assessment is that of : SPELLS, ill defined.     I agree with dr Frederico Hamman at [facility] care that the patient has spells that may be neurologic in origin.   According to Dr Frederico Hamman no ophthalmological abnormality or deficit. I was able to review her MRI brain and special cuts to the pituitary gland, which show no recurrence.   A migraine is a possible differential, but there is no nausea, no photophobia- no assocociated phonophobia.  The feeling of a visual curtain coming down, cloudy and gray can be seen with amaurosis fugax, but she describes this in both eyes. She has no bruit , no irregular rhythm of the heart, but we will order a carotid duplex. I ask her to allow enough time to sleep and to hydrate well.    Plan:  Treatment plan and additional workup : Carotid Doppler.    CC Ramiro Harvest, MD   Asencion Partridge Cyndy Braver MD 08/09/2018

## 2018-08-16 DIAGNOSIS — H90A22 Sensorineural hearing loss, unilateral, left ear, with restricted hearing on the contralateral side: Secondary | ICD-10-CM | POA: Diagnosis not present

## 2018-08-19 ENCOUNTER — Other Ambulatory Visit: Payer: Self-pay | Admitting: Neurology

## 2018-08-19 ENCOUNTER — Telehealth: Payer: Self-pay | Admitting: Neurology

## 2018-08-19 DIAGNOSIS — H539 Unspecified visual disturbance: Secondary | ICD-10-CM

## 2018-08-19 NOTE — Telephone Encounter (Signed)
Order completed and fixed

## 2018-08-19 NOTE — Telephone Encounter (Signed)
Can you re- Order Doppler Please . Carotid  -- # [id number] choose [facility] . Thanks Hinton Dyer .

## 2018-08-27 ENCOUNTER — Telehealth: Payer: Self-pay | Admitting: Neurology

## 2018-08-27 NOTE — Telephone Encounter (Signed)
Noted, appreciated the call

## 2018-08-27 NOTE — Telephone Encounter (Signed)
Pt called to inform us that she would like to wait on any tests being ordered for her for now. She would like to see if she can continue her care in [facility]. Pt would like to express her appreciation for all that was done for her while she was a pt here.

## 2018-08-30 ENCOUNTER — Ambulatory Visit (HOSPITAL_COMMUNITY): Payer: BLUE CROSS/BLUE SHIELD

## 2018-11-16 IMAGING — CT CT CERVICAL SPINE W/O CM
5 of 8 series · 13 of 33 positions shown, 14 images · non-contrast
Comparison: Cervical spine radiographs 04/15/2010, CT head
11/07/2009

CLINICAL DATA: Patient fell while roller-skating. Loss of
consciousness and right-sided headache.

EXAM:
CT HEAD WITHOUT CONTRAST
CT CERVICAL SPINE WITHOUT CONTRAST
TECHNIQUE: Multidetector CT imaging of the head and cervical spine was
performed following the standard protocol without intravenous
contrast. Multiplanar CT image reconstructions of the cervical spine
were also generated.

[Series 3: head 2.0 h70h · axial · 0.42mm/px · z∈[+949,+1001]mm · 2 of 80 slices shown]
[im 27/80  bone]
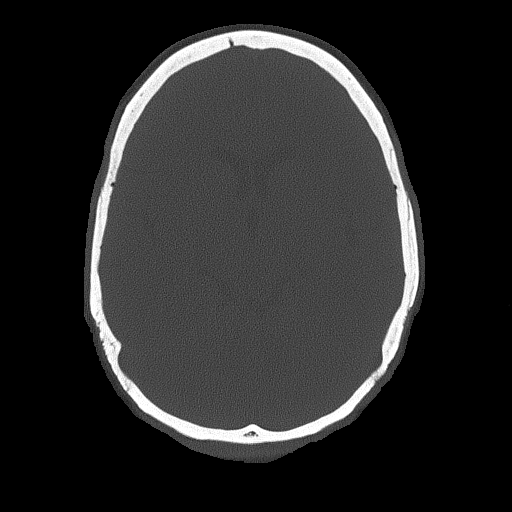
[im 53/80  bone]
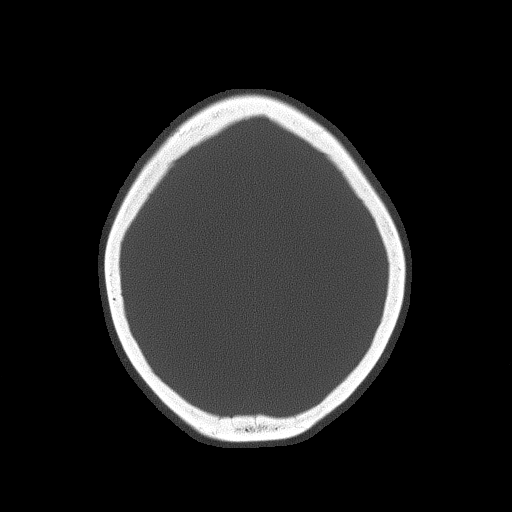

[Series 7: c_spine 2.0 i30s 3 · axial · 0.34mm/px · z∈[+825,+881]mm · 2 of 86 slices shown]
[im 29/86  bone]
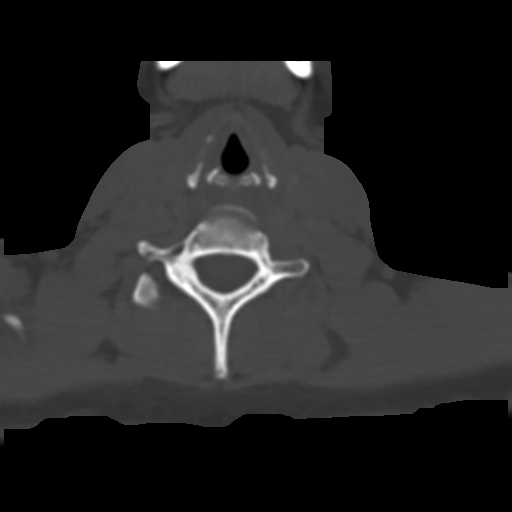
[im 57/86  bone]
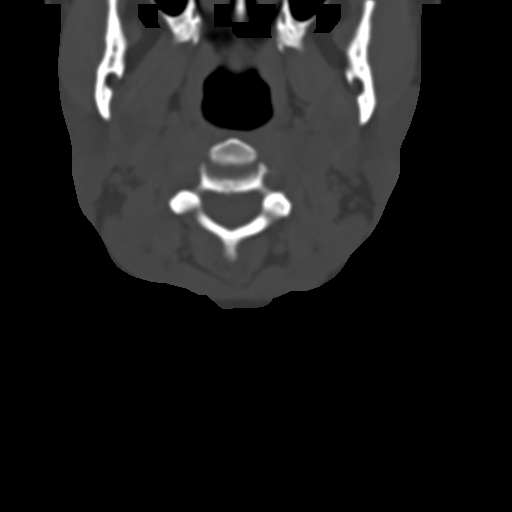

[Series 9: coronals · coronal · 0.29mm/px · 1 of 59 slices shown]
[im 30/59  bone]
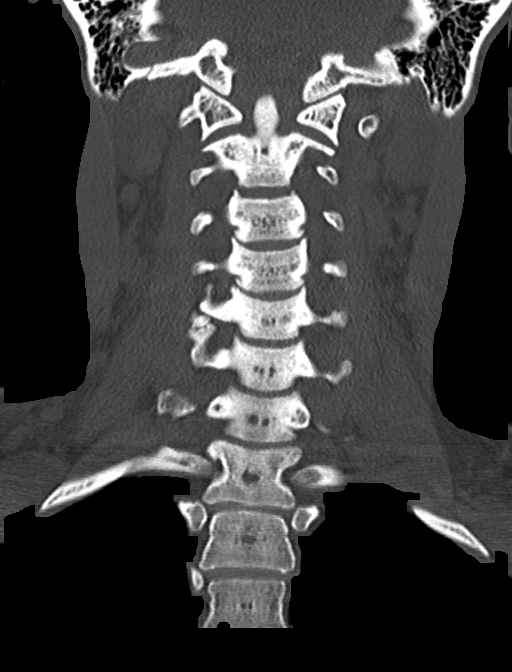

[Series 10: sagittals · sagittal · 0.28mm/px · 5 of 65 slices shown]
[im 11/65  bone]
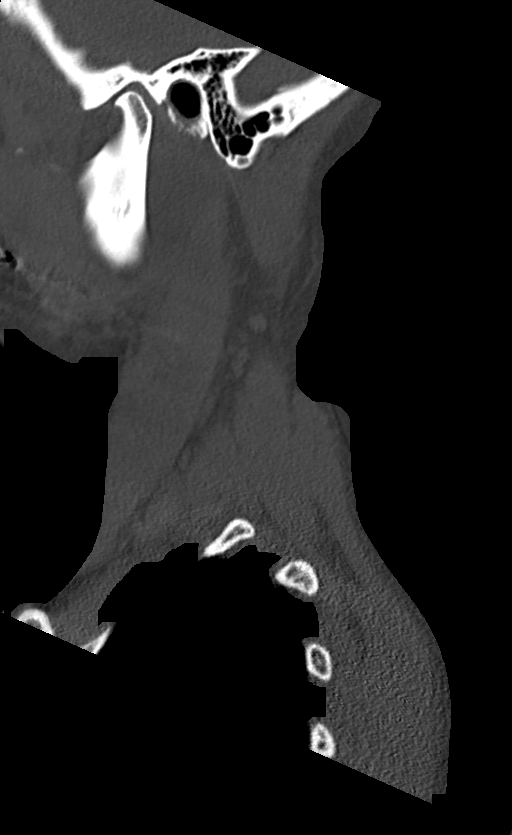
[im 22/65  bone]
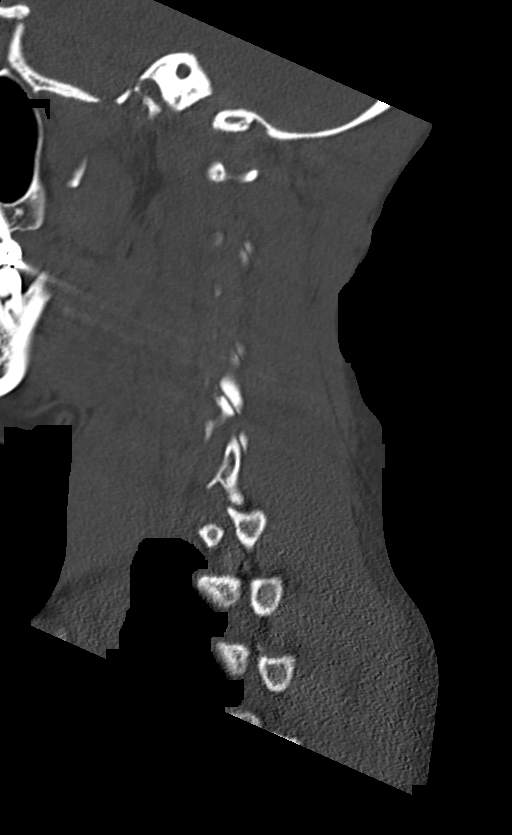
[im 33/65  bone]
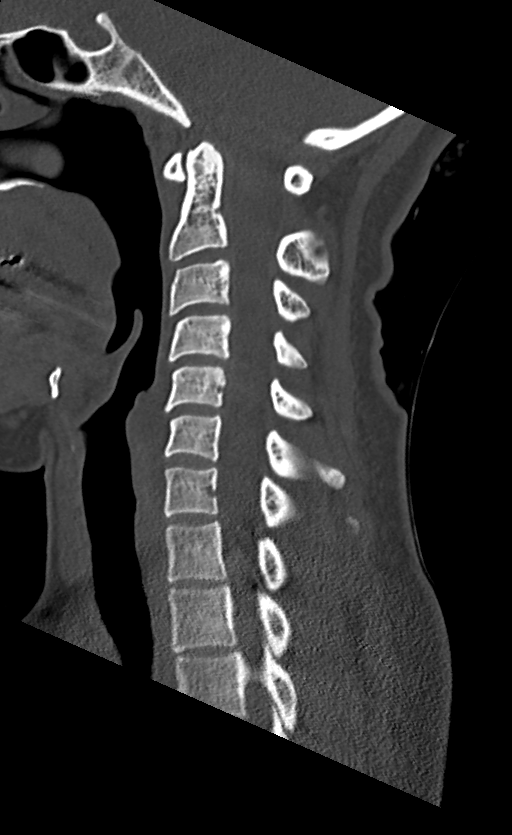
[im 43/65  bone]
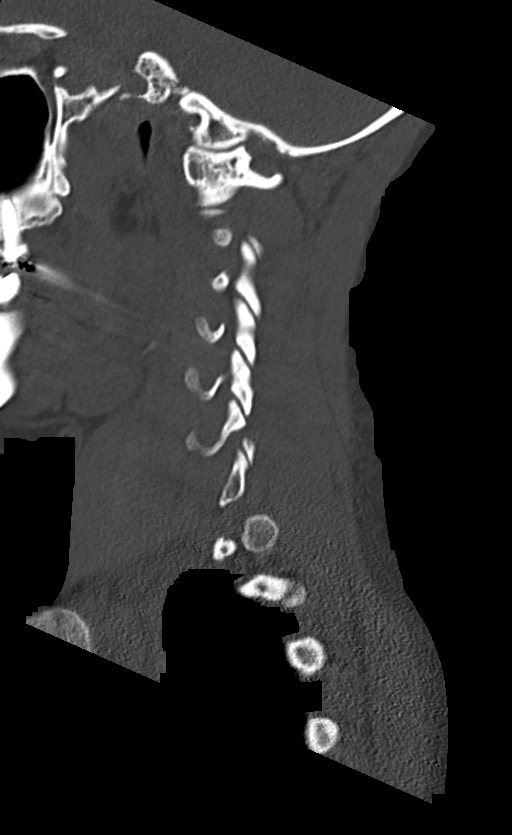
[im 54/65  bone]
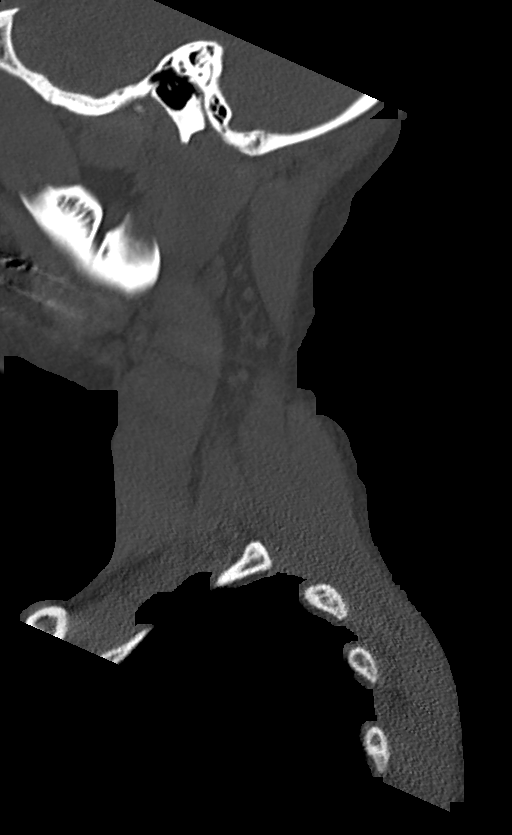

[Series 11: orthogonals · axial · 0.23mm/px · z∈[+771,+872]mm · 3 of 111 slices shown, 4 images]
[im 28/111  soft-tissue]
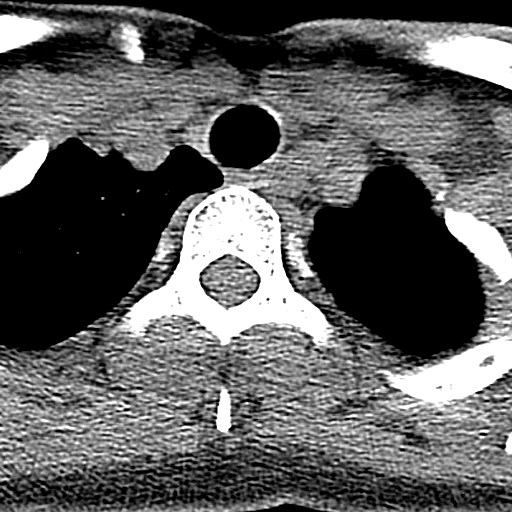
[im 28/111  bone]
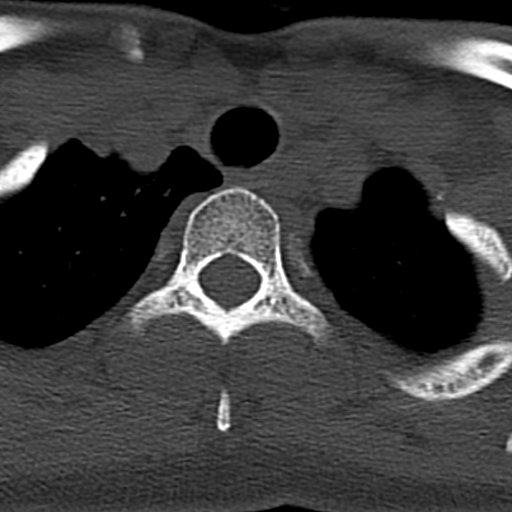
[im 56/111  bone]
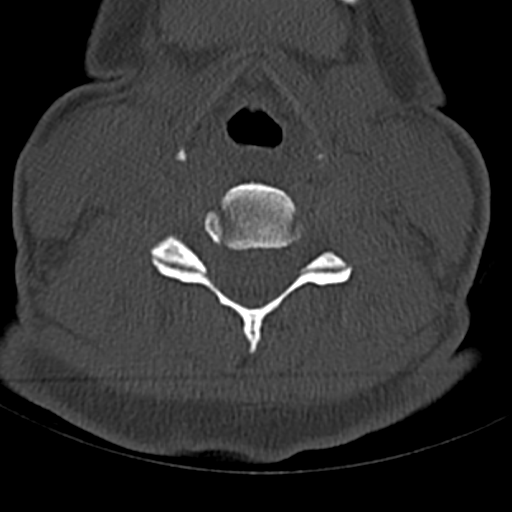
[im 83/111  bone]
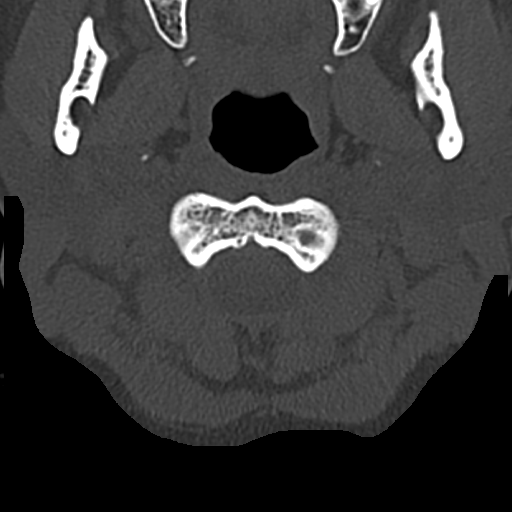

[13 of 33 positions shown; findings below may reference images not displayed]

FINDINGS: CT HEAD FINDINGS

Brain: No evidence of acute infarction, hemorrhage, hydrocephalus,
extra-axial collection or mass lesion/mass effect.

Vascular: No hyperdense vessel or unexpected calcification.

Skull: Normal. Negative for fracture or focal lesion.

Sinuses/Orbits: No acute finding.

Other: Mild soft tissue swelling of the right parietal scalp.

CT CERVICAL SPINE FINDINGS

Alignment: Normal. No splaying of the lateral masses of C1 on CT.
Mild asymmetry about the odontoid process on the AP view may be due
to positioning of the head.

Skull base and vertebrae: No acute fracture. No primary bone lesion
or focal pathologic process.

Soft tissues and spinal canal: No prevertebral fluid or swelling. No
visible canal hematoma.

Disc levels: No significant central or neural foraminal
encroachment. No focal disc herniations.

Upper chest: Negative.

Other: None
IMPRESSION: 1. No acute intracranial abnormality.
2. No acute cervical spine fracture or subluxation.
3. Mild right parietal scalp soft tissue swelling.
